# Patient Record
Sex: Male | Born: 1955 | Race: White | Hispanic: No | Marital: Married | Smoking: Current every day smoker
Health system: Southern US, Community
[De-identification: ages and names within clinical notes are randomized; demographics above are authoritative.]

---

## 2008-11-24 ENCOUNTER — Emergency Department (HOSPITAL_COMMUNITY): Admission: EM | Admit: 2008-11-24 | Discharge: 2008-11-24 | Payer: Self-pay | Admitting: Emergency Medicine

## 2010-09-02 LAB — POCT I-STAT, CHEM 8
BUN: 7 mg/dL (ref 6–23)
Calcium, Ion: 1.05 mmol/L — ABNORMAL LOW (ref 1.12–1.32)
Creatinine, Ser: 1.1 mg/dL (ref 0.4–1.5)
Hemoglobin: 15.3 g/dL (ref 13.0–17.0)
Sodium: 138 mEq/L (ref 135–145)
TCO2: 19 mmol/L (ref 0–100)

## 2012-04-20 ENCOUNTER — Ambulatory Visit (HOSPITAL_COMMUNITY)
Admission: RE | Admit: 2012-04-20 | Discharge: 2012-04-20 | Disposition: A | Discharge status: Home / Self Care | Payer: Disability Insurance | Source: Ambulatory Visit | Attending: Family Medicine | Admitting: Family Medicine

## 2012-04-20 ENCOUNTER — Other Ambulatory Visit (HOSPITAL_COMMUNITY): Payer: Self-pay | Admitting: *Deleted

## 2012-04-20 DIAGNOSIS — M25519 Pain in unspecified shoulder: Secondary | ICD-10-CM

## 2012-04-20 DIAGNOSIS — M25569 Pain in unspecified knee: Secondary | ICD-10-CM | POA: Insufficient documentation

## 2016-08-13 ENCOUNTER — Emergency Department (HOSPITAL_COMMUNITY): Payer: Medicaid Other

## 2016-08-13 ENCOUNTER — Observation Stay (HOSPITAL_COMMUNITY)
Admission: EM | Admit: 2016-08-13 | Discharge: 2016-08-14 | Disposition: A | Discharge status: Home / Self Care | Payer: Medicaid Other | Attending: Family Medicine | Admitting: Family Medicine

## 2016-08-13 ENCOUNTER — Encounter (HOSPITAL_COMMUNITY): Payer: Self-pay | Admitting: Emergency Medicine

## 2016-08-13 DIAGNOSIS — Z7189 Other specified counseling: Secondary | ICD-10-CM

## 2016-08-13 DIAGNOSIS — F17210 Nicotine dependence, cigarettes, uncomplicated: Secondary | ICD-10-CM | POA: Diagnosis not present

## 2016-08-13 DIAGNOSIS — R109 Unspecified abdominal pain: Secondary | ICD-10-CM | POA: Diagnosis present

## 2016-08-13 DIAGNOSIS — R627 Adult failure to thrive: Secondary | ICD-10-CM | POA: Diagnosis present

## 2016-08-13 DIAGNOSIS — K869 Disease of pancreas, unspecified: Secondary | ICD-10-CM | POA: Diagnosis not present

## 2016-08-13 DIAGNOSIS — F1290 Cannabis use, unspecified, uncomplicated: Secondary | ICD-10-CM | POA: Insufficient documentation

## 2016-08-13 DIAGNOSIS — R0602 Shortness of breath: Secondary | ICD-10-CM | POA: Insufficient documentation

## 2016-08-13 DIAGNOSIS — R6251 Failure to thrive (child): Secondary | ICD-10-CM

## 2016-08-13 DIAGNOSIS — K8689 Other specified diseases of pancreas: Secondary | ICD-10-CM

## 2016-08-13 DIAGNOSIS — Z515 Encounter for palliative care: Secondary | ICD-10-CM

## 2016-08-13 LAB — COMPREHENSIVE METABOLIC PANEL
ALBUMIN: 3.2 g/dL — AB (ref 3.5–5.0)
ALT: 11 U/L — ABNORMAL LOW (ref 17–63)
ANION GAP: 11 (ref 5–15)
AST: 23 U/L (ref 15–41)
Alkaline Phosphatase: 158 U/L — ABNORMAL HIGH (ref 38–126)
BUN: 15 mg/dL (ref 6–20)
CALCIUM: 9 mg/dL (ref 8.9–10.3)
CHLORIDE: 98 mmol/L — AB (ref 101–111)
CO2: 25 mmol/L (ref 22–32)
CREATININE: 1.05 mg/dL (ref 0.61–1.24)
GFR calc non Af Amer: >60 mL/min (ref >60)
GLUCOSE: 117 mg/dL — AB (ref 65–99)
Potassium: 3.8 mmol/L (ref 3.5–5.1)
SODIUM: 134 mmol/L — AB (ref 135–145)
Total Bilirubin: 0.4 mg/dL (ref 0.3–1.2)
Total Protein: 6.6 g/dL (ref 6.5–8.1)

## 2016-08-13 LAB — CBC WITH DIFFERENTIAL/PLATELET
BASOS PCT: 0 %
Basophils Absolute: 0 10*3/uL (ref 0.0–0.1)
EOS ABS: 0.1 10*3/uL (ref 0.0–0.7)
Eosinophils Relative: 1 %
HCT: 39.4 % (ref 39.0–52.0)
Hemoglobin: 13.5 g/dL (ref 13.0–17.0)
Lymphocytes Relative: 15 %
Lymphs Abs: 1.4 10*3/uL (ref 0.7–4.0)
MCH: 31.5 pg (ref 26.0–34.0)
MCHC: 34.3 g/dL (ref 30.0–36.0)
MCV: 91.8 fL (ref 78.0–100.0)
MONO ABS: 0.9 10*3/uL (ref 0.1–1.0)
MONOS PCT: 10 %
NEUTROS PCT: 74 %
Neutro Abs: 7.2 10*3/uL (ref 1.7–7.7)
PLATELETS: 397 10*3/uL (ref 150–400)
RBC: 4.29 MIL/uL (ref 4.22–5.81)
RDW: 14.4 % (ref 11.5–15.5)
WBC: 9.6 10*3/uL (ref 4.0–10.5)

## 2016-08-13 LAB — TSH: TSH: 1.779 u[IU]/mL (ref 0.350–4.500)

## 2016-08-13 LAB — URINALYSIS, ROUTINE W REFLEX MICROSCOPIC
Bilirubin Urine: NEGATIVE
Glucose, UA: NEGATIVE mg/dL
Hgb urine dipstick: NEGATIVE
Leukocytes, UA: NEGATIVE
NITRITE: NEGATIVE
PH: 6 (ref 5.0–8.0)
Protein, ur: 100 mg/dL — AB
SPECIFIC GRAVITY, URINE: 1.015 (ref 1.005–1.030)

## 2016-08-13 LAB — TROPONIN I

## 2016-08-13 LAB — URINALYSIS, MICROSCOPIC (REFLEX): RBC / HPF: NONE SEEN RBC/hpf (ref 0–5)

## 2016-08-13 LAB — ETHANOL

## 2016-08-13 LAB — LIPASE, BLOOD: Lipase: 24 U/L (ref 11–51)

## 2016-08-13 MED ORDER — DEXTROSE 5 % IV SOLN
500.0000 mg | INTRAVENOUS | Status: DC
  Administered 2016-08-13: 500 mg via INTRAVENOUS
  Filled 2016-08-13: qty 500

## 2016-08-13 MED ORDER — ONDANSETRON HCL 4 MG/2ML IJ SOLN: 4.0000 mg | Freq: Once | INTRAMUSCULAR | Status: DC

## 2016-08-13 MED ORDER — MORPHINE SULFATE (PF) 4 MG/ML IV SOLN: 4.0000 mg | Freq: Once | INTRAVENOUS | Status: DC

## 2016-08-13 MED ORDER — SODIUM CHLORIDE 0.9 % IV SOLN
1000.0000 mL | Freq: Once | INTRAVENOUS | Status: AC
  Administered 2016-08-13: 1000 mL via INTRAVENOUS

## 2016-08-13 MED ORDER — DEXTROSE 5 % IV SOLN
1.0000 g | Freq: Once | INTRAVENOUS | Status: AC
  Administered 2016-08-13: 1 g via INTRAVENOUS
  Filled 2016-08-13: qty 10

## 2016-08-13 MED ORDER — IOPAMIDOL (ISOVUE-300) INJECTION 61%
100.0000 mL | Freq: Once | INTRAVENOUS | Status: AC | PRN
  Administered 2016-08-13: 100 mL via INTRAVENOUS

## 2016-08-13 MED ORDER — MORPHINE SULFATE (PF) 4 MG/ML IV SOLN
4.0000 mg | Freq: Once | INTRAVENOUS | Status: AC
  Administered 2016-08-13: 4 mg via INTRAVENOUS
  Filled 2016-08-13: qty 1

## 2016-08-13 NOTE — ED Provider Notes (Signed)
[facility]-EMERGENCY DEPT Provider Note   CSN: [id number] Arrival date & time: 08/13/16  1916     History   Chief Complaint Chief Complaint  Patient presents with  . Abdominal Pain    HPI Jeffrey Ware is a 61 y.o. male.  Pt presents to the ED today with abdominal pain and sob.  The pt said his pain has been going on for the last few weeks.  The SOB has just been bothering him in the last few days.  The pt said that he could not lie flat.  He gets sob walking across the room.  He said he's lost 50 pounds in 3 months.  His friend, who brings him in, said that his wife died in 03/30/2023 and he has been very depressed.  She said people have been bringing him food, but he does not eat it.  He does not go anywhere.  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  He has not seen a doctor in 30 years, so has no medical history.  He only came tonight because his sob was so bad that he thought he was going to die.      History reviewed. No pertinent past medical history.  Patient Active Problem List   Diagnosis Date Noted  . Palliative care encounter   . Goals of care, counseling/discussion   . DNR (do not resuscitate) discussion   . Encounter for hospice care discussion   . Pancreatic mass 08/13/2016  . FTT (failure to thrive) in adult 08/13/2016  . Abdominal pain 08/13/2016    History reviewed. No pertinent surgical history.     Home Medications    Prior to Admission medications   Medication Sig Start Date End Date Taking? Authorizing Provider  feeding supplement, ENSURE ENLIVE, (ENSURE ENLIVE) LIQD Take 237 mLs by mouth 2 (two) times daily between meals. 08/14/16   Filbert Schilder, MD  LORazepam (ATIVAN) 1 MG tablet Take 1 tablet (1 mg total) by mouth 2 (two) times daily as needed for anxiety or sleep. 08/14/16 08/16/16  Filbert Schilder, MD  ondansetron (ZOFRAN) 4 MG tablet Take 1 tablet (4 mg total) by mouth every 6 (six) hours as needed for nausea. 08/14/16   Filbert Schilder, MD  oxyCODONE (OXY IR/ROXICODONE) 5 MG immediate release tablet Take 1 tablet (5 mg total) by mouth every 4 (four) hours as needed for moderate pain. 08/14/16   Filbert Schilder, MD  oxyCODONE (OXYCONTIN) 10 mg 12 hr tablet Take 1 tablet (10 mg total) by mouth every 12 (twelve) hours. 08/14/16   Filbert Schilder, MD    Family History History reviewed. No pertinent family history.  Social History Social History  Substance Use Topics  . Smoking status: Current Every Day Smoker    Packs/day: 0.50    Types: Cigarettes  . Smokeless tobacco: Never Used  . Alcohol use No     Comment: quit x3 months      Allergies   Patient has no known allergies.   Review of Systems Review of Systems  Respiratory: Positive for shortness of breath.   Gastrointestinal: Positive for abdominal pain.  All other systems reviewed and are negative.    Physical Exam Updated Vital Signs BP 108/74 (BP Location: Right Arm)   Pulse (!) 109   Temp 97.5 F (36.4 C) (Oral)   Resp 20   Ht 5\' 10"  (1.778 m)   Wt 116 lb 12.8 oz (53 kg)   SpO2 100%  BMI 16.76 kg/m   Physical Exam  Constitutional: He is oriented to person, place, and time. He appears cachectic.  HENT:  Head: Normocephalic and atraumatic.  Right Ear: External ear normal.  Left Ear: External ear normal.  Nose: Nose normal.  Mouth/Throat: Mucous membranes are dry.  Eyes: Conjunctivae and EOM are normal. Pupils are equal, round, and reactive to light.  Neck: Normal range of motion. Neck supple.  Cardiovascular: Normal rate, regular rhythm, normal heart sounds and intact distal pulses.   Pulmonary/Chest: Effort normal and breath sounds normal.  Abdominal: There is generalized tenderness.  Musculoskeletal: Normal range of motion.  Neurological: He is alert and oriented to person, place, and time.  Skin: Skin is warm.  Psychiatric: He has a normal mood and affect. His behavior is normal. Judgment and thought content normal.    Nursing note and vitals reviewed.    ED Treatments / Results  Labs (all labs ordered are listed, but only abnormal results are displayed) Labs Reviewed  COMPREHENSIVE METABOLIC PANEL - Abnormal; Notable for the following:       Result Value   Sodium 134 (*)    Chloride 98 (*)    Glucose, Bld 117 (*)    Albumin 3.2 (*)    ALT 11 (*)    Alkaline Phosphatase 158 (*)    All other components within normal limits  URINALYSIS, ROUTINE W REFLEX MICROSCOPIC - Abnormal; Notable for the following:    Ketones, ur TRACE (*)    Protein, ur 100 (*)    All other components within normal limits  URINALYSIS, MICROSCOPIC (REFLEX) - Abnormal; Notable for the following:    Bacteria, UA RARE (*)    Squamous Epithelial / LPF 0-5 (*)    All other components within normal limits  CANCER ANTIGEN 19-9 - Abnormal; Notable for the following:    CA 19-9 16,770 (*)    All other components within normal limits  CBC WITH DIFFERENTIAL/PLATELET  LIPASE, BLOOD  TROPONIN I  ETHANOL  TSH    EKG  EKG Interpretation  Date/Time:  Tuesday August 13 2016 20:05:59 EDT Ventricular Rate:  115 PR Interval:    QRS Duration: 101 QT Interval:  338 QTC Calculation: 468 R Axis:   90 Text Interpretation:  Sinus tachycardia Anterior infarct, old Confirmed by [facility] MD, Alayasia Breeding [id number]) on 08/13/2016 8:29:09 PM        Procedures Procedures (including critical care time)  Medications Ordered in ED Medications  0.9 %  sodium chloride infusion (1,000 mLs Intravenous New Bag/Given 08/13/16 2009)  iopamidol (ISOVUE-300) 61 % injection 100 mL (100 mLs Intravenous Contrast Given 08/13/16 2112)  morphine 4 MG/ML injection 4 mg (4 mg Intravenous Given 08/13/16 2205)  cefTRIAXone (ROCEPHIN) 1 g in dextrose 5 % 50 mL IVPB (0 g Intravenous Stopped 08/13/16 2322)     Initial Impression / Assessment and Plan / ED Course  I have reviewed the triage vital signs and the nursing notes.  Pertinent labs & imaging results that  were available during my care of the patient were reviewed by me and considered in my medical decision making (see chart for details).    CT scan results pending at time of shift change.  Pt signed out to Dr. Adriana Simas.  Final Clinical Impressions(s) / ED Diagnoses   Final diagnoses:  Failure to thrive (0-17)  Pancreatic mass    New Prescriptions Discharge Medication List as of 08/14/2016  2:09 PM    START taking these medications   Details  feeding supplement, ENSURE ENLIVE, (ENSURE ENLIVE) LIQD Take 237 mLs by mouth 2 (two) times daily between meals., Starting Wed 08/14/2016, Print    LORazepam (ATIVAN) 1 MG tablet Take 1 tablet (1 mg total) by mouth 2 (two) times daily as needed for anxiety or sleep., Starting Wed 08/14/2016, Until Fri 08/16/2016, Print    ondansetron (ZOFRAN) 4 MG tablet Take 1 tablet (4 mg total) by mouth every 6 (six) hours as needed for nausea., Starting Wed 08/14/2016, Print    oxyCODONE (OXY IR/ROXICODONE) 5 MG immediate release tablet Take 1 tablet (5 mg total) by mouth every 4 (four) hours as needed for moderate pain., Starting Wed 08/14/2016, Print    oxyCODONE (OXYCONTIN) 10 mg 12 hr tablet Take 1 tablet (10 mg total) by mouth every 12 (twelve) hours., Starting Wed 08/14/2016, Print         Jacalyn LefevreJulie Ahnesty Finfrock, MD 08/15/16 1536

## 2016-08-13 NOTE — ED Triage Notes (Signed)
abd pain and sob with activity x 3months.  Has lost over 50lbs in the last 3 months.  Has had no medical care for over 30 years.

## 2016-08-13 NOTE — ED Provider Notes (Signed)
CT scan chest and abdomen reveal a probable metastatic pancreatic cancer. Additionally, there is a concern for atypical pneumonia. Will start Rocephin and Zithromax. Admit to general medicine.   Donnetta HutchingBrian Deloss Amico, MD 08/13/16 2312

## 2016-08-14 ENCOUNTER — Encounter (HOSPITAL_COMMUNITY): Payer: Self-pay | Admitting: *Deleted

## 2016-08-14 DIAGNOSIS — Z515 Encounter for palliative care: Secondary | ICD-10-CM

## 2016-08-14 DIAGNOSIS — K869 Disease of pancreas, unspecified: Secondary | ICD-10-CM

## 2016-08-14 DIAGNOSIS — R627 Adult failure to thrive: Secondary | ICD-10-CM

## 2016-08-14 DIAGNOSIS — R109 Unspecified abdominal pain: Secondary | ICD-10-CM

## 2016-08-14 DIAGNOSIS — Z7189 Other specified counseling: Secondary | ICD-10-CM

## 2016-08-14 MED ORDER — SODIUM CHLORIDE 0.9% FLUSH: 3.0000 mL | INTRAVENOUS | Status: DC | PRN

## 2016-08-14 MED ORDER — SODIUM CHLORIDE 0.9% FLUSH
3.0000 mL | Freq: Two times a day (BID) | INTRAVENOUS | Status: DC
  Administered 2016-08-14: 3 mL via INTRAVENOUS

## 2016-08-14 MED ORDER — LORAZEPAM 1 MG PO TABS
1.0000 mg | ORAL_TABLET | Freq: Two times a day (BID) | ORAL | Status: DC | PRN
  Administered 2016-08-14: 1 mg via ORAL
  Filled 2016-08-14: qty 1

## 2016-08-14 MED ORDER — OXYCODONE HCL 5 MG PO TABS: 5.0000 mg | ORAL_TABLET | ORAL | 0 refills | Status: AC | PRN

## 2016-08-14 MED ORDER — ONDANSETRON HCL 4 MG PO TABS: 4.0000 mg | ORAL_TABLET | Freq: Four times a day (QID) | ORAL | 0 refills | Status: AC | PRN

## 2016-08-14 MED ORDER — SODIUM CHLORIDE 0.9 % IV SOLN: 250.0000 mL | INTRAVENOUS | Status: DC | PRN

## 2016-08-14 MED ORDER — LORAZEPAM 1 MG PO TABS: 1.0000 mg | ORAL_TABLET | Freq: Two times a day (BID) | ORAL | 0 refills | Status: AC | PRN

## 2016-08-14 MED ORDER — ENSURE ENLIVE PO LIQD: 237.0000 mL | Freq: Two times a day (BID) | ORAL | 0 refills | Status: AC

## 2016-08-14 MED ORDER — ONDANSETRON HCL 4 MG/2ML IJ SOLN: 4.0000 mg | Freq: Four times a day (QID) | INTRAMUSCULAR | Status: DC | PRN

## 2016-08-14 MED ORDER — OXYCODONE HCL ER 10 MG PO T12A
10.0000 mg | EXTENDED_RELEASE_TABLET | Freq: Two times a day (BID) | ORAL | Status: DC
  Administered 2016-08-14: 10 mg via ORAL
  Filled 2016-08-14: qty 1

## 2016-08-14 MED ORDER — ENSURE ENLIVE PO LIQD: 237.0000 mL | Freq: Two times a day (BID) | ORAL | Status: DC

## 2016-08-14 MED ORDER — OXYCODONE HCL 5 MG PO TABS
5.0000 mg | ORAL_TABLET | ORAL | Status: DC | PRN
  Administered 2016-08-14: 5 mg via ORAL
  Filled 2016-08-14 (×2): qty 1

## 2016-08-14 MED ORDER — OXYCODONE HCL ER 10 MG PO T12A: 10.0000 mg | EXTENDED_RELEASE_TABLET | Freq: Two times a day (BID) | ORAL | 0 refills | Status: AC

## 2016-08-14 MED ORDER — ONDANSETRON HCL 4 MG PO TABS: 4.0000 mg | ORAL_TABLET | Freq: Four times a day (QID) | ORAL | Status: DC | PRN

## 2016-08-14 MED ORDER — ORAL CARE MOUTH RINSE
15.0000 mL | Freq: Two times a day (BID) | OROMUCOSAL | Status: DC
  Administered 2016-08-14 (×2): 15 mL via OROMUCOSAL

## 2016-08-14 NOTE — Discharge Summary (Signed)
Physician Discharge Summary  MUNG RINKER ZOX:[id number] DOB: [birth year] DOA: 08/13/2016  PCP: No PCP Per Patient  Admit date: 08/13/2016 Discharge date: 08/17/2016  Admitted From: Home Disposition:  Home with Hospice Services   Recommendations for Outpatient Follow-up:  1. Follow up with PCP in 1-2 weeks 2. Hospice to come to your house 3. Pain medications as prescribed  Home Health: Yes- hospice services  Equipment/Devices: None   Discharge Condition: Hospice  CODE STATUS: Full Code Diet recommendation: Regular  Brief/Interim Summary: Jeffrey Ware is a 61 y.o. male with medical history significant of nothing comes in with 2-3 month history of progressive worsening abdominal pain to the point he cannot handle it anymore which brought him to the ED tonight.  Pt reports loosing his wife in 03/12/2023 she died of sepsis.  Since her death, he has felt like he would not live past 6 months.  He has lost over 50 lbs since her death and thought this was related to his grief.  He does have one daughter.  The abdominal pain started almost 3 months ago, mainly in the epigastric area and back.  It is severe and he has no pain meds to take at home that are strong enough to help.  He lives alone.  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  Pt referred for admission for newly diagnosis of cancer and pain management.  He has not had any cough or fever.  Discharge Diagnoses:  Principal Problem:   Pancreatic mass Active Problems:   FTT (failure to thrive) in adult   Abdominal pain   Palliative care encounter   Goals of care, counseling/discussion   DNR (do not resuscitate) discussion   Encounter for hospice care discussion  Patient decided to forgo diagnostic thoracentesis and states he would like hospice services as he does not want to pursue treatment options.  Discussed with oncology who state treatment for this advanced cancer which is likely pancreatic is  palliative and for mostly symptom relief.  Palliative care consulted and assisted in setting patient up with home hospice services.  Discharge Instructions  Discharge Instructions    Activity as tolerated - No restrictions    Complete by:  As directed    Call Jeffrey Ware for:  difficulty breathing, headache or visual disturbances    Complete by:  As directed    Call Jeffrey Ware for:  severe uncontrolled pain    Complete by:  As directed    Diet general    Complete by:  As directed    Discharge instructions    Complete by:  As directed    Get prescriptions filled to last until Hospice comes out to see you tomorrow morning     Allergies as of 08/14/2016   No Known Allergies     Medication List    TAKE these medications   feeding supplement (ENSURE ENLIVE) Liqd Take 237 mLs by mouth 2 (two) times daily between meals.   ondansetron 4 MG tablet Commonly known as:  ZOFRAN Take 1 tablet (4 mg total) by mouth every 6 (six) hours as needed for nausea.   oxyCODONE 5 MG immediate release tablet Commonly known as:  Oxy IR/ROXICODONE Take 1 tablet (5 mg total) by mouth every 4 (four) hours as needed for moderate pain.   oxyCODONE 10 mg 12 hr tablet Commonly known as:  OXYCONTIN Take 1 tablet (10 mg total) by mouth every 12 (twelve) hours.     ASK your doctor about  these medications   LORazepam 1 MG tablet Commonly known as:  ATIVAN Take 1 tablet (1 mg total) by mouth 2 (two) times daily as needed for anxiety or sleep. Ask about: Should I take this medication?      Follow-up Information    KEFALAS,THOMAS, PA-C Follow up.   Specialty:  Oncology Why:  office to call patient foe appointment. Contact information: [address] [place] [address] [phone]        [facility] Follow up.   Contact information: 24-hr on call number [phone]         No Known Allergies  Consultations:  Oncology  Palliative Medicine  CM   Procedures/Studies: Ct Chest W  Contrast  Result Date: 08/13/2016 CLINICAL DATA:  Chronic generalized abdominal pain and shortness of breath. Loss of 50 pounds over the past 3 months. Initial encounter. EXAM: CT CHEST, ABDOMEN, AND PELVIS WITH CONTRAST TECHNIQUE: Multidetector CT imaging of the chest, abdomen and pelvis was performed following the standard protocol during bolus administration of intravenous contrast. CONTRAST:  ISOVUE-300 IOPAMIDOL (ISOVUE-300) INJECTION 61% COMPARISON:  CT of the chest abdomen and pelvis performed 11/24/2008 FINDINGS: CT CHEST FINDINGS Cardiovascular: Diffuse coronary artery calcifications are seen. The heart remains normal in size. The thoracic aorta is unremarkable, aside from scattered calcification along the aortic arch and proximal great vessels. Mediastinum/Nodes: No mediastinal lymphadenopathy is seen. No pericardial effusion is identified. Tiny hypodensities within the thyroid gland are likely benign, given their size. No axillary lymphadenopathy is appreciated. Lungs/Pleura: Numerous nodules and opacities of varying size are noted throughout both lungs, along with small to moderate right and trace left pleural effusions. There is underlying interstitial prominence. This is highly suspicious for metastatic disease, with superimposed pneumonia, possibly atypical in nature. Underlying emphysema is noted at the lung apices. No pneumothorax is seen. Musculoskeletal: No acute osseous abnormalities are identified. The visualized musculature is unremarkable in appearance. CT ABDOMEN PELVIS FINDINGS Hepatobiliary: Multiple masses of varying size are noted within the liver, with surrounding areas of contrast blush, concerning for metastatic disease given the finding in the pancreas. The gallbladder is grossly unremarkable in appearance. The common bile duct remains normal in caliber. Pancreas: There is a very poorly defined diffusely infiltrative mass at the expected location of the body of the pancreas,  measuring approximately 6.2 x 5.8 x 5.3 cm, with enlarged surrounding nodes and encasement of the abdominal aorta at the level of the renal arteries. There is mild compression of the abdominal aorta at this level. The mass abuts the lesser curvature of the stomach. The remaining proximal body and head of the pancreas are grossly unremarkable. Diffuse prominence of surrounding vasculature tracking into the pancreatic body likely reflects angiogenesis. Findings are compatible with primary pancreatic malignancy. There is marked narrowing of the superior mesenteric vein and near complete effacement of the splenic vein due to the underlying mass. The mass partially encases the inferior vena cava and wraps about the anterior aspect of vertebral body L2. Spleen: Vague fluid is seen tracking about the spleen. The spleen is otherwise unremarkable in appearance. Adrenals/Urinary Tract: There is decreased attenuation involving portions of the left adrenal gland, likely reflecting infiltration of mass, given surrounding tumor. The right adrenal gland is unremarkable in appearance. There is marked chronic right renal atrophy. Minimal left-sided hydronephrosis is of uncertain significance. The left kidney is otherwise grossly unremarkable. Stomach/Bowel: The appendix is normal in caliber, without evidence of appendicitis. The colon is grossly unremarkable in appearance. The  visualized small bowel is grossly unremarkable. Vascular/Lymphatic: Scattered calcification is seen along the abdominal aorta and its branches. As described above, there is mild compression of the abdominal aorta at the level of the mass. No definite retroperitoneal or pelvic sidewall lymphadenopathy is seen. Reproductive: The bladder is mildly distended and grossly unremarkable. The prostate remains normal in size, with minimal calcification. Other: A small amount of free fluid is seen tracking within the abdomen and pelvis. Musculoskeletal: No acute osseous  abnormalities are identified. The visualized musculature is unremarkable in appearance. IMPRESSION: 1. Primary pancreatic malignancy, with a large very poorly defined diffusely infiltrative mass at the expected location of the body of the pancreas, measuring 6.2 x 5.8 x 5.3 cm. Vague mass encases the abdominal aorta at the level of the renal arteries, with mild compression of the abdominal aorta. Diffuse prominence of surrounding vasculature tracking into the pancreatic body, likely reflecting angiogenesis. 2. Marked narrowing of the superior mesenteric vein and near complete effacement of the splenic vein due to the underlying mass. The mass partially encases the inferior vena cava and wraps about the anterior aspect of vertebral body L2. 3. Small to moderate right and trace left pleural effusions, with numerous nodules and opacities of varying size throughout both lungs. Underlying interstitial prominence noted. This is highly suspicious of metastatic disease, with superimposed pneumonia, possibly atypical in nature. 4. Multiple masses of varying size within the liver, with surrounding areas of contrast blush, concerning for metastatic disease. 5. Decreased attenuation involving portions of the left adrenal gland, likely reflecting infiltration of mass. 6. Small amount of fluid noted tracking about the abdomen and pelvis. 7. Scattered aortic atherosclerosis. 8. Diffuse coronary artery calcification. 9. Marked chronic right renal atrophy. Minimal left-sided hydronephrosis, of uncertain significance. These results were called by telephone at the time of interpretation on 08/13/2016 at 10:29 pm to Dr. Adriana Simas, who verbally acknowledged these results. Electronically Signed   By: Roanna Raider M.D.   On: 08/13/2016 22:30   Ct Abdomen Pelvis W Contrast  Result Date: 08/13/2016 CLINICAL DATA:  Chronic generalized abdominal pain and shortness of breath. Loss of 50 pounds over the past 3 months. Initial encounter. EXAM:  CT CHEST, ABDOMEN, AND PELVIS WITH CONTRAST TECHNIQUE: Multidetector CT imaging of the chest, abdomen and pelvis was performed following the standard protocol during bolus administration of intravenous contrast. CONTRAST:  ISOVUE-300 IOPAMIDOL (ISOVUE-300) INJECTION 61% COMPARISON:  CT of the chest abdomen and pelvis performed 11/24/2008 FINDINGS: CT CHEST FINDINGS Cardiovascular: Diffuse coronary artery calcifications are seen. The heart remains normal in size. The thoracic aorta is unremarkable, aside from scattered calcification along the aortic arch and proximal great vessels. Mediastinum/Nodes: No mediastinal lymphadenopathy is seen. No pericardial effusion is identified. Tiny hypodensities within the thyroid gland are likely benign, given their size. No axillary lymphadenopathy is appreciated. Lungs/Pleura: Numerous nodules and opacities of varying size are noted throughout both lungs, along with small to moderate right and trace left pleural effusions. There is underlying interstitial prominence. This is highly suspicious for metastatic disease, with superimposed pneumonia, possibly atypical in nature. Underlying emphysema is noted at the lung apices. No pneumothorax is seen. Musculoskeletal: No acute osseous abnormalities are identified. The visualized musculature is unremarkable in appearance. CT ABDOMEN PELVIS FINDINGS Hepatobiliary: Multiple masses of varying size are noted within the liver, with surrounding areas of contrast blush, concerning for metastatic disease given the finding in the pancreas. The gallbladder is grossly unremarkable in appearance. The common bile duct remains normal in caliber. Pancreas:  There is a very poorly defined diffusely infiltrative mass at the expected location of the body of the pancreas, measuring approximately 6.2 x 5.8 x 5.3 cm, with enlarged surrounding nodes and encasement of the abdominal aorta at the level of the renal arteries. There is mild compression of  the abdominal aorta at this level. The mass abuts the lesser curvature of the stomach. The remaining proximal body and head of the pancreas are grossly unremarkable. Diffuse prominence of surrounding vasculature tracking into the pancreatic body likely reflects angiogenesis. Findings are compatible with primary pancreatic malignancy. There is marked narrowing of the superior mesenteric vein and near complete effacement of the splenic vein due to the underlying mass. The mass partially encases the inferior vena cava and wraps about the anterior aspect of vertebral body L2. Spleen: Vague fluid is seen tracking about the spleen. The spleen is otherwise unremarkable in appearance. Adrenals/Urinary Tract: There is decreased attenuation involving portions of the left adrenal gland, likely reflecting infiltration of mass, given surrounding tumor. The right adrenal gland is unremarkable in appearance. There is marked chronic right renal atrophy. Minimal left-sided hydronephrosis is of uncertain significance. The left kidney is otherwise grossly unremarkable. Stomach/Bowel: The appendix is normal in caliber, without evidence of appendicitis. The colon is grossly unremarkable in appearance. The visualized small bowel is grossly unremarkable. Vascular/Lymphatic: Scattered calcification is seen along the abdominal aorta and its branches. As described above, there is mild compression of the abdominal aorta at the level of the mass. No definite retroperitoneal or pelvic sidewall lymphadenopathy is seen. Reproductive: The bladder is mildly distended and grossly unremarkable. The prostate remains normal in size, with minimal calcification. Other: A small amount of free fluid is seen tracking within the abdomen and pelvis. Musculoskeletal: No acute osseous abnormalities are identified. The visualized musculature is unremarkable in appearance. IMPRESSION: 1. Primary pancreatic malignancy, with a large very poorly defined diffusely  infiltrative mass at the expected location of the body of the pancreas, measuring 6.2 x 5.8 x 5.3 cm. Vague mass encases the abdominal aorta at the level of the renal arteries, with mild compression of the abdominal aorta. Diffuse prominence of surrounding vasculature tracking into the pancreatic body, likely reflecting angiogenesis. 2. Marked narrowing of the superior mesenteric vein and near complete effacement of the splenic vein due to the underlying mass. The mass partially encases the inferior vena cava and wraps about the anterior aspect of vertebral body L2. 3. Small to moderate right and trace left pleural effusions, with numerous nodules and opacities of varying size throughout both lungs. Underlying interstitial prominence noted. This is highly suspicious of metastatic disease, with superimposed pneumonia, possibly atypical in nature. 4. Multiple masses of varying size within the liver, with surrounding areas of contrast blush, concerning for metastatic disease. 5. Decreased attenuation involving portions of the left adrenal gland, likely reflecting infiltration of mass. 6. Small amount of fluid noted tracking about the abdomen and pelvis. 7. Scattered aortic atherosclerosis. 8. Diffuse coronary artery calcification. 9. Marked chronic right renal atrophy. Minimal left-sided hydronephrosis, of uncertain significance. These results were called by telephone at the time of interpretation on 08/13/2016 at 10:29 pm to Dr. Adriana Simasook, who verbally acknowledged these results. Electronically Signed   By: Roanna RaiderJeffery  Chang M.D.   On: 08/13/2016 22:30      Subjective: Patient slouched in bed.  Voices he has significant abdominal pain- pain medicine needing to be given more frequently to help control pain.  Discharge Exam: Vitals:   08/13/16 2300 08/14/16 0038  BP: 119/85 108/74  Pulse: (!) 110 (!) 109  Resp: 20 20  Temp:  97.5 F (36.4 C)   Vitals:   08/13/16 2205 08/13/16 2300 08/14/16 0000 08/14/16 0038   BP: 128/83 119/85  108/74  Pulse: (!) 109 (!) 110  (!) 109  Resp: (!) 26 20  20   Temp:    97.5 F (36.4 C)  TempSrc:    Oral  SpO2: 99% 99%  100%  Weight:    53 kg (116 lb 12.8 oz)  Height:   5\' 10"  (1.778 m)     General: Pt is alert, awake, mild distress, appears in pain, thin cachectic male Cardiovascular: RRR, S1/S2 +, no rubs, no gallops Respiratory: CTA bilaterally, no wheezing, no rhonchi Abdominal: Soft, NT, ND, bowel sounds + Extremities: no edema, no cyanosis    The results of significant diagnostics from this hospitalization (including imaging, microbiology, ancillary and laboratory) are listed below for reference.     Microbiology: No results found for this or any previous visit (from the past 240 hour(s)).   Labs: BNP (last 3 results) No results for input(s): BNP in the last 8760 hours. Basic Metabolic Panel:  Recent Labs Lab 08/13/16 2006  NA 134*  K 3.8  CL 98*  CO2 25  GLUCOSE 117*  BUN 15  CREATININE 1.05  CALCIUM 9.0   Liver Function Tests:  Recent Labs Lab 08/13/16 2006  AST 23  ALT 11*  ALKPHOS 158*  BILITOT 0.4  PROT 6.6  ALBUMIN 3.2*    Recent Labs Lab 08/13/16 2006  LIPASE 24   No results for input(s): AMMONIA in the last 168 hours. CBC:  Recent Labs Lab 08/13/16 2006  WBC 9.6  NEUTROABS 7.2  HGB 13.5  HCT 39.4  MCV 91.8  PLT 397   Cardiac Enzymes:  Recent Labs Lab 08/13/16 2006  TROPONINI <0.03   BNP: Invalid input(s): POCBNP CBG: No results for input(s): GLUCAP in the last 168 hours. D-Dimer No results for input(s): DDIMER in the last 72 hours. Hgb A1c No results for input(s): HGBA1C in the last 72 hours. Lipid Profile No results for input(s): CHOL, HDL, LDLCALC, TRIG, CHOLHDL, LDLDIRECT in the last 72 hours. Thyroid function studies No results for input(s): TSH, T4TOTAL, T3FREE, THYROIDAB in the last 72 hours.  Invalid input(s): FREET3 Anemia work up No results for input(s): VITAMINB12, FOLATE,  FERRITIN, TIBC, IRON, RETICCTPCT in the last 72 hours. Urinalysis    Component Value Date/Time   COLORURINE YELLOW 08/13/2016 2200   APPEARANCEUR CLEAR 08/13/2016 2200   LABSPEC 1.015 08/13/2016 2200   PHURINE 6.0 08/13/2016 2200   GLUCOSEU NEGATIVE 08/13/2016 2200   HGBUR NEGATIVE 08/13/2016 2200   BILIRUBINUR NEGATIVE 08/13/2016 2200   KETONESUR TRACE (A) 08/13/2016 2200   PROTEINUR 100 (A) 08/13/2016 2200   NITRITE NEGATIVE 08/13/2016 2200   LEUKOCYTESUR NEGATIVE 08/13/2016 2200   Sepsis Labs Invalid input(s): PROCALCITONIN,  WBC,  LACTICIDVEN Microbiology No results found for this or any previous visit (from the past 240 hour(s)).   Time coordinating discharge: 40 minutes  SIGNED:   Katrinka Blazing, Jeffrey Ware  [facility] 08/17/2016, 6:08 PM Pager [phone] If 7PM-7AM, please contact night-coverage [email or web address] Password [email or web address]

## 2016-08-14 NOTE — Care Management Note (Signed)
Case Management Note  Patient Details  Name: Jeffrey Ware MRN: [id number] Date of Birth: [birth year]  Subjective/Objective:                  Pt admitted with pancreatic mass. He has terminal illness, has seen oncology and palliative. Pt wishes to DC home with hospice services. Pt has no PCP. Pt has no DME needs at this time. Pt has chosen [facility].  Action/Plan: Referral sent to [facility], case discussed with Shon HaleMary Beth. Hospice MD will see pt and assume care. Pt will be admitted to hospice services in AM at his home. Will DC home today. No other needs communicated by pt.   Expected Discharge Date:      08/14/2016            Expected Discharge Plan:  Home w Hospice Care  In-House Referral:  Hospice / Palliative Care  Discharge planning Services  CM Consult  Post Acute Care Choice:  Hospice Choice offered to:  Patient  [facility]  Status of Service:  Completed, signed off  [name], [name], RN 08/14/2016, 12:55 PM

## 2016-08-14 NOTE — Care Management (Signed)
Patient Information   Patient Name Jeffrey Ware, Jeffrey Ware ([id number]) Sex Male DOB 1956/02/20  Room Bed  A339 A339-01  Patient Demographics   Address [address] [place] [address] Phone [phone] (Home)  Patient Ethnicity & Race   Ethnic Group Patient Race  Not Hispanic or Latino White or Caucasian  Emergency Contact(s)   Name Relation Home Work Mobile  Jan Phyl VillageLee,Roy Friend [phone]    Ulysees BarnsWray,April Daughter [phone]    Documents on File    Status Date Received Description  Documents for the Patient  [place] HIPAA NOTICE OF PRIVACY - Scanned Not Received    [place] E-Signature HIPAA Notice of Privacy Received 04/20/12   Driver's License Not Received    Insurance Card Not Received    Advance Directives/Living Will/HCPOA/POA Not Received    Other Photo ID Not Received    Documents for the Encounter  AOB (Assignment of Insurance Benefits) Not Received    E-signature AOB Signed 08/13/16   MEDICARE RIGHTS Not Received    E-signature Medicare Rights     ED Patient Billing Extract   ED PB Summary  ED Patient Billing Extract   ED Encounter Summary  Cardiac Monitoring Strip  08/14/16   EKG  08/14/16   Admission Information   Attending Provider Admitting Provider Admission Type Admission Date/Time  Filbert SchilderAlexandria U Kadolph, MD Haydee Monicaachal A David, MD Emergency 08/13/16 1951  Discharge Date Hospital Service Auth/Cert Status Service Area   Internal Medicine Incomplete [place] SERVICE AREA  Unit Room/Bed Admission Status   AP-DEPT 300 A339/A339-01 Admission (Confirmed)   Admission   Complaint  sob - abd pain  Hospital Account   Name Acct ID Class Status Primary Coverage  Jeffrey Ware, Jeffrey Ware [id number] Observation Open None      Guarantor Account (for Hospital Account [id number])   Name Relation to Pt Service Area Active? Acct Type  Jeffrey Ware, Jeffrey Ware Self [facility] Yes Personal/Family  Address Phone    [address] [place], [place] [address] [phone](H)        Coverage Information  (for Hospital Account [id number])

## 2016-08-14 NOTE — Consult Note (Signed)
Consultation Note Date: 08/14/2016   Patient Name: Jeffrey Ware  DOB: 07-20-1955  MRN: [id number]  Age / Sex: 61 y.o., male  PCP: No Pcp Per Patient Referring Physician: Filbert Schilder, MD  Reason for Consultation: Establishing goals of care, Hospice Evaluation and Psychosocial/spiritual support  HPI/Patient Profile: 61 y.o. male  with past medical history of nothing pertinent admitted on 08/13/2016 with pancreatic mass with metastatic burden.   Clinical Assessment and Goals of Care: Mr. Pultz is resting quietly in bed. He greets me making but not keeping eye contact as I enter. Present at bedside today is friend Luanna Cole, and his wife, also friend Ree Kida.  Channing Mutters asks about nutrition, stating that Mr. Thoma has not been eating well and losing weight. I asked Mr. Ortez if his friends know what's going on with him, he states that they do. I share with Channing Mutters that we call cancer a thief, because it takes nutrition, muscle, and life; at this point Mr. Stock nods his head affirmatively. We talk about nutrition and the use of Ensure type drinks.  I share that it would not surprise me if in the future Mr. Brosky is only interested in drinking and not eating. I share the people near end of life often sleep more, interact less, and eat less. This is normal and expected.  We talk about symptom management. Mr. Coffin states that he is been taking oxycodone at home (he does not have a prescriber). He states that he takes 2 or 3 (5 mg) oxycodone per day. He states that his pain has worsened and this dose is no longer effective. Oxycodone 10 mg scheduled Q 12 hours, along with Ativan 1 mg PRN.   We talk about diagnostics, Mr. Walston states that he feels that he is dying, and sees no need for further testing including needle biopsies. I share with him that we will support his decisions, what he feels is best for himself. Mr. Reierson states he  wants to return to his own home where he feels he would be more comfortable.  We talk about a support system. Mr. Sprinkle friend Ree Kida and Channing Mutters state that they help provide care and someone is with him almost every day (but not 24 hours). I share the benefits of hospice, Mr. Mckenzie states that he feels this would be a good benefit for him and he's accepting of hospice in home. Angelica Chessman states that he is familiar with hospice and agrees that they would be of benefit to Mr. Friedland.  Healthcare power of attorney NEXT OF KIN - we talk about healthcare power of attorney, Mr. Scow states that he has never made a power of attorney, he has always been able to make choices for himself.  I share that legally, his daughter April is his decision maker, but he can name anyone he likes by filling out paperwork. I share that hospice will help them complete this paperwork if he so desires.   SUMMARY OF RECOMMENDATIONS   Home with the benefits  of hospice, no further cancer workup, no biopsies. Mr. Bastos states his goal is comfort and dignity.  Code Status/Advance Care Planning:  DNR - we discuss the concepts of allow a natural death. Mr. Earll elects for DNR today.  Symptom Management:   Increased Oxycodone to 10 mg Q 12 hours, with 5 mg Q4 hours PRN, added Ativan 1 mg Q 6 hours PRN  Palliative Prophylaxis:   Frequent Pain Assessment  Additional Recommendations (Limitations, Scope, Preferences):  treat the treatable, no extraordinary measures  Psycho-social/Spiritual:   Desire for further Chaplaincy support:no  Additional Recommendations: Education on Hospice  Prognosis:   < 3 months, would not be surprising based on metastatic cancer burden, and desire to focus on comfort and dignity.   Discharge Planning: Home with Hospice      Primary Diagnoses: Present on Admission: . Pancreatic mass . FTT (failure to thrive) in adult . Abdominal pain   I have reviewed the medical record, interviewed the  patient and family, and examined the patient. The following aspects are pertinent.  History reviewed. No pertinent past medical history. Social History   Social History  . Marital status: Married    Spouse name: N/A  . Number of children: N/A  . Years of education: N/A   Social History Main Topics  . Smoking status: Current Every Day Smoker    Packs/day: 0.50    Types: Cigarettes  . Smokeless tobacco: Never Used  . Alcohol use No     Comment: quit x3 months   . Drug use: Yes    Frequency: 1.0 time per week    Types: Marijuana     Comment: also uses rx opiates that he buys off the street   . Sexual activity: Not Asked   Other Topics Concern  . None   Social History Narrative  . None   History reviewed. No pertinent family history. Scheduled Meds: . feeding supplement (ENSURE ENLIVE)  237 mL Oral BID BM  . mouth rinse  15 mL Mouth Rinse BID  . oxyCODONE  10 mg Oral Q12H  . sodium chloride flush  3 mL Intravenous Q12H   Continuous Infusions: PRN Meds:.sodium chloride, LORazepam, ondansetron **OR** ondansetron (ZOFRAN) IV, oxyCODONE, sodium chloride flush Medications Prior to Admission:  Prior to Admission medications   Medication Sig Start Date End Date Taking? Authorizing Provider  feeding supplement, ENSURE ENLIVE, (ENSURE ENLIVE) LIQD Take 237 mLs by mouth 2 (two) times daily between meals. 08/14/16   Filbert Schilder, MD  LORazepam (ATIVAN) 1 MG tablet Take 1 tablet (1 mg total) by mouth 2 (two) times daily as needed for anxiety or sleep. 08/14/16 08/16/16  Filbert Schilder, MD  ondansetron (ZOFRAN) 4 MG tablet Take 1 tablet (4 mg total) by mouth every 6 (six) hours as needed for nausea. 08/14/16   Filbert Schilder, MD  oxyCODONE (OXY IR/ROXICODONE) 5 MG immediate release tablet Take 1 tablet (5 mg total) by mouth every 4 (four) hours as needed for moderate pain. 08/14/16   Filbert Schilder, MD  oxyCODONE (OXYCONTIN) 10 mg 12 hr tablet Take 1 tablet (10 mg  total) by mouth every 12 (twelve) hours. 08/14/16   Filbert Schilder, MD   No Known Allergies Review of Systems  Unable to perform ROS: Acuity of condition    Physical Exam  Constitutional: He is oriented to person, place, and time. No distress.  Makes but not keeps eye contact.   HENT:  Head: Normocephalic and atraumatic.  Cardiovascular:  Normal rate and regular rhythm.   Rate 110's at times  Pulmonary/Chest: Effort normal. No respiratory distress.  Abdominal: Soft. He exhibits no distension.  Musculoskeletal:  Severe muscle wasting  Neurological: He is oriented to person, place, and time.  Skin: Skin is warm and dry.  Nursing note and vitals reviewed.   Vital Signs: BP 108/74 (BP Location: Right Arm)   Pulse (!) 109   Temp 97.5 F (36.4 C) (Oral)   Resp 20   Ht 5\' 10"  (1.778 m)   Wt 53 kg (116 lb 12.8 oz)   SpO2 100%   BMI 16.76 kg/m  Pain Assessment: 0-10   Pain Score: 4    SpO2: SpO2: 100 % O2 Device:SpO2: 100 % O2 Flow Rate: .   IO: Intake/output summary:  Intake/Output Summary (Last 24 hours) at 08/14/16 1448 Last data filed at 08/13/16 2322  Gross per 24 hour  Intake               50 ml  Output                0 ml  Net               50 ml    LBM: Last BM Date: 08/10/16 Baseline Weight: Weight: 52.6 kg (116 lb) Most recent weight: Weight: 53 kg (116 lb 12.8 oz)     Palliative Assessment/Data:   Flowsheet Rows     Most Recent Value  Intake Tab  Referral Department  Hospitalist  Unit at Time of Referral  Med/Surg Unit  Palliative Care Primary Diagnosis  Cancer  Date Notified  08/14/16  Palliative Care Type  New Palliative care  Reason for referral  Clarify Goals of Care  Date of Admission  08/13/16  Date first seen by Palliative Care  08/14/16  # of days Palliative referral response time  0 Day(s)  # of days IP prior to Palliative referral  1  Clinical Assessment  Palliative Performance Scale Score  50%  Pain Max last 24 hours  Not able  to report  Pain Min Last 24 hours  Not able to report  Dyspnea Max Last 24 Hours  Not able to report  Dyspnea Min Last 24 hours  Not able to report  Psychosocial & Spiritual Assessment  Palliative Care Outcomes  Patient/Family meeting held?  Yes  Who was at the meeting?  friend Channing MuttersRoy and his wife, partner Ree KidaJack  Patient/Family wishes: Interventions discontinued/not started   Mechanical Ventilation  Palliative Care follow-up planned  -- [follow up while at [facility]]      Time In: 1115 Time Out: 1205 Time Total: 50 minutes Greater than 50%  of this time was spent counseling and coordinating care related to the above assessment and plan.  Signed by: Katheran Aweasha A Juliett Eastburn, NP   Please contact Palliative Medicine Team phone at [phone] for questions and concerns.  For individual provider: See Loretha StaplerAmion

## 2016-08-14 NOTE — Final Consult Note (Signed)
Oncology consulted on this patient with metastatic disease concerning for pancreatic cancer radiographically.  Palliative care was consulted and based upon their discussion, the patient has opted for Hospice intervention at home which is certainly reasonable.  I have personally discussed with Lillia Carmelasha Dove, NP (Palliative medicine).  If he were to change his mind, I would recommend a diagnostic thoracentesis.  If positive, then we have confirmed metastatic pancreatic cancer.  If negative, then possibly an US guided biopsy of liver lesion.    Either way, he has a terminal diagnosis.  Patient is not seen.  No charge.  If plan of care changes, please re-consult medical oncology.  Medical oncology signing off.  Kerly Rigsbee, PA-C 08/14/2016 1:22 PM

## 2016-08-14 NOTE — H&P (Signed)
History and Physical    Jeffrey Ware ZOX:[id number] DOB: 1956/04/11 DOA: 08/13/2016  PCP: No PCP Per Patient  Patient coming from: home  Chief Complaint:   Abdominal pain  HPI: Jeffrey Ware is a 61 y.o. male with medical history significant of nothing comes in with 2-3 month history of progressive worsening abdominal pain to the point he cannot handle it anymore which brought him to the ED tonight.  Pt reports loosing his wife in 2023-03-18 she died of sepsis.  Since her death, he has felt like he would not live past 6 months.  He has lost over 50 lbs since her death and thought this was related to his grief.  He does have one daughter.  The abdominal pain started almost 3 months ago, mainly in the epigastric area and back.  It is severe and he has no pain meds to take at home that are strong enough to help.  He lives alone.  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  Pt referred for admission for newly diagnosis of cancer and pain management.  He has not had any cough or fever.  Review of Systems: As per HPI otherwise 10 point review of systems negative.   History reviewed. No pertinent past medical history. none  History reviewed. No pertinent surgical history.   reports that he has been smoking Cigarettes.  He has been smoking about 0.50 packs per day. He has never used smokeless tobacco. He reports that he uses drugs, including Marijuana, about 1 time per week. He reports that he does not drink alcohol.  No Known Allergies  No family history on file. no premature CAD  Prior to Admission medications   Not on File  none  Physical Exam: Vitals:   08/13/16 1944 08/13/16 2205 08/13/16 2300  BP: 102/62 128/83 119/85  Pulse: 86 (!) 109 (!) 110  Resp: 20 (!) 26 20  Temp: 98.3 F (36.8 C)    TempSrc: Oral    SpO2: 100% 99% 99%  Weight: 52.6 kg (116 lb)    Height: 5\' 10"  (1.778 m)      Constitutional: NAD, calm, comfortable Vitals:   08/13/16 1944  08/13/16 2205 08/13/16 2300  BP: 102/62 128/83 119/85  Pulse: 86 (!) 109 (!) 110  Resp: 20 (!) 26 20  Temp: 98.3 F (36.8 C)    TempSrc: Oral    SpO2: 100% 99% 99%  Weight: 52.6 kg (116 lb)    Height: 5\' 10"  (1.778 m)     Eyes: PERRL, lids and conjunctivae normal ENMT: Mucous membranes are moist. Posterior pharynx clear of any exudate or lesions.Normal dentition.  Neck: normal, supple, no masses, no thyromegaly Respiratory: clear to auscultation bilaterally, no wheezing, no crackles. Normal respiratory effort. No accessory muscle use.  Cardiovascular: Regular rate and rhythm, no murmurs / rubs / gallops. No extremity edema. 2+ pedal pulses. No carotid bruits.  Abdomen: no tenderness, no masses palpated. No hepatosplenomegaly. Bowel sounds positive.  Musculoskeletal: no clubbing / cyanosis. No joint deformity upper and lower extremities. Good ROM, no contractures. Normal muscle tone.  Skin: no rashes, lesions, ulcers. No induration Neurologic: CN 2-12 grossly intact. Sensation intact, DTR normal. Strength 5/5 in all 4.  Psychiatric: Normal judgment and insight. Alert and oriented x 3. Normal mood.    Labs on Admission: I have personally reviewed following labs and imaging studies  CBC:  Recent Labs Lab 08/13/16 2006  WBC 9.6  NEUTROABS 7.2  HGB 13.5  HCT 39.4  MCV 91.8  PLT 397   Basic Metabolic Panel:  Recent Labs Lab 08/13/16 2006  NA 134*  K 3.8  CL 98*  CO2 25  GLUCOSE 117*  BUN 15  CREATININE 1.05  CALCIUM 9.0   GFR: Estimated Creatinine Clearance: 54.3 mL/min (by C-G formula based on SCr of 1.05 mg/dL). Liver Function Tests:  Recent Labs Lab 08/13/16 2006  AST 23  ALT 11*  ALKPHOS 158*  BILITOT 0.4  PROT 6.6  ALBUMIN 3.2*    Recent Labs Lab 08/13/16 2006  LIPASE 24   Cardiac Enzymes:  Recent Labs Lab 08/13/16 2006  TROPONINI <0.03   Thyroid Function Tests:  Recent Labs  08/13/16 2006  TSH 1.779   Urine analysis:    Component  Value Date/Time   COLORURINE YELLOW 08/13/2016 2200   APPEARANCEUR CLEAR 08/13/2016 2200   LABSPEC 1.015 08/13/2016 2200   PHURINE 6.0 08/13/2016 2200   GLUCOSEU NEGATIVE 08/13/2016 2200   HGBUR NEGATIVE 08/13/2016 2200   BILIRUBINUR NEGATIVE 08/13/2016 2200   KETONESUR TRACE (A) 08/13/2016 2200   PROTEINUR 100 (A) 08/13/2016 2200   NITRITE NEGATIVE 08/13/2016 2200   LEUKOCYTESUR NEGATIVE 08/13/2016 2200    Radiological Exams on Admission: Ct Chest W Contrast  Result Date: 08/13/2016 CLINICAL DATA:  Chronic generalized abdominal pain and shortness of breath. Loss of 50 pounds over the past 3 months. Initial encounter. EXAM: CT CHEST, ABDOMEN, AND PELVIS WITH CONTRAST TECHNIQUE: Multidetector CT imaging of the chest, abdomen and pelvis was performed following the standard protocol during bolus administration of intravenous contrast. CONTRAST:  100mL ISOVUE-300 IOPAMIDOL (ISOVUE-300) INJECTION 61% COMPARISON:  CT of the chest abdomen and pelvis performed 11/24/2008 FINDINGS: CT CHEST FINDINGS Cardiovascular: Diffuse coronary artery calcifications are seen. The heart remains normal in size. The thoracic aorta is unremarkable, aside from scattered calcification along the aortic arch and proximal great vessels. Mediastinum/Nodes: No mediastinal lymphadenopathy is seen. No pericardial effusion is identified. Tiny hypodensities within the thyroid gland are likely benign, given their size. No axillary lymphadenopathy is appreciated. Lungs/Pleura: Numerous nodules and opacities of varying size are noted throughout both lungs, along with small to moderate right and trace left pleural effusions. There is underlying interstitial prominence. This is highly suspicious for metastatic disease, with superimposed pneumonia, possibly atypical in nature. Underlying emphysema is noted at the lung apices. No pneumothorax is seen. Musculoskeletal: No acute osseous abnormalities are identified. The visualized musculature  is unremarkable in appearance. CT ABDOMEN PELVIS FINDINGS Hepatobiliary: Multiple masses of varying size are noted within the liver, with surrounding areas of contrast blush, concerning for metastatic disease given the finding in the pancreas. The gallbladder is grossly unremarkable in appearance. The common bile duct remains normal in caliber. Pancreas: There is a very poorly defined diffusely infiltrative mass at the expected location of the body of the pancreas, measuring approximately 6.2 x 5.8 x 5.3 cm, with enlarged surrounding nodes and encasement of the abdominal aorta at the level of the renal arteries. There is mild compression of the abdominal aorta at this level. The mass abuts the lesser curvature of the stomach. The remaining proximal body and head of the pancreas are grossly unremarkable. Diffuse prominence of surrounding vasculature tracking into the pancreatic body likely reflects angiogenesis. Findings are compatible with primary pancreatic malignancy. There is marked narrowing of the superior mesenteric vein and near complete effacement of the splenic vein due to the underlying mass. The mass partially encases the inferior vena cava and wraps about the anterior  aspect of vertebral body L2. Spleen: Vague fluid is seen tracking about the spleen. The spleen is otherwise unremarkable in appearance. Adrenals/Urinary Tract: There is decreased attenuation involving portions of the left adrenal gland, likely reflecting infiltration of mass, given surrounding tumor. The right adrenal gland is unremarkable in appearance. There is marked chronic right renal atrophy. Minimal left-sided hydronephrosis is of uncertain significance. The left kidney is otherwise grossly unremarkable. Stomach/Bowel: The appendix is normal in caliber, without evidence of appendicitis. The colon is grossly unremarkable in appearance. The visualized small bowel is grossly unremarkable. Vascular/Lymphatic: Scattered calcification is  seen along the abdominal aorta and its branches. As described above, there is mild compression of the abdominal aorta at the level of the mass. No definite retroperitoneal or pelvic sidewall lymphadenopathy is seen. Reproductive: The bladder is mildly distended and grossly unremarkable. The prostate remains normal in size, with minimal calcification. Other: A small amount of free fluid is seen tracking within the abdomen and pelvis. Musculoskeletal: No acute osseous abnormalities are identified. The visualized musculature is unremarkable in appearance. IMPRESSION: 1. Primary pancreatic malignancy, with a large very poorly defined diffusely infiltrative mass at the expected location of the body of the pancreas, measuring 6.2 x 5.8 x 5.3 cm. Vague mass encases the abdominal aorta at the level of the renal arteries, with mild compression of the abdominal aorta. Diffuse prominence of surrounding vasculature tracking into the pancreatic body, likely reflecting angiogenesis. 2. Marked narrowing of the superior mesenteric vein and near complete effacement of the splenic vein due to the underlying mass. The mass partially encases the inferior vena cava and wraps about the anterior aspect of vertebral body L2. 3. Small to moderate right and trace left pleural effusions, with numerous nodules and opacities of varying size throughout both lungs. Underlying interstitial prominence noted. This is highly suspicious of metastatic disease, with superimposed pneumonia, possibly atypical in nature. 4. Multiple masses of varying size within the liver, with surrounding areas of contrast blush, concerning for metastatic disease. 5. Decreased attenuation involving portions of the left adrenal gland, likely reflecting infiltration of mass. 6. Small amount of fluid noted tracking about the abdomen and pelvis. 7. Scattered aortic atherosclerosis. 8. Diffuse coronary artery calcification. 9. Marked chronic right renal atrophy. Minimal  left-sided hydronephrosis, of uncertain significance. These results were called by telephone at the time of interpretation on 08/13/2016 at 10:29 pm to Dr. Adriana Simas, who verbally acknowledged these results. Electronically Signed   By: Roanna Raider M.D.   On: 08/13/2016 22:30   Ct Abdomen Pelvis W Contrast  Result Date: 08/13/2016 CLINICAL DATA:  Chronic generalized abdominal pain and shortness of breath. Loss of 50 pounds over the past 3 months. Initial encounter. EXAM: CT CHEST, ABDOMEN, AND PELVIS WITH CONTRAST TECHNIQUE: Multidetector CT imaging of the chest, abdomen and pelvis was performed following the standard protocol during bolus administration of intravenous contrast. CONTRAST:  ISOVUE-300 IOPAMIDOL (ISOVUE-300) INJECTION 61% COMPARISON:  CT of the chest abdomen and pelvis performed 11/24/2008 FINDINGS: CT CHEST FINDINGS Cardiovascular: Diffuse coronary artery calcifications are seen. The heart remains normal in size. The thoracic aorta is unremarkable, aside from scattered calcification along the aortic arch and proximal great vessels. Mediastinum/Nodes: No mediastinal lymphadenopathy is seen. No pericardial effusion is identified. Tiny hypodensities within the thyroid gland are likely benign, given their size. No axillary lymphadenopathy is appreciated. Lungs/Pleura: Numerous nodules and opacities of varying size are noted throughout both lungs, along with small to moderate right and trace left pleural effusions. There is  underlying interstitial prominence. This is highly suspicious for metastatic disease, with superimposed pneumonia, possibly atypical in nature. Underlying emphysema is noted at the lung apices. No pneumothorax is seen. Musculoskeletal: No acute osseous abnormalities are identified. The visualized musculature is unremarkable in appearance. CT ABDOMEN PELVIS FINDINGS Hepatobiliary: Multiple masses of varying size are noted within the liver, with surrounding areas of contrast blush,  concerning for metastatic disease given the finding in the pancreas. The gallbladder is grossly unremarkable in appearance. The common bile duct remains normal in caliber. Pancreas: There is a very poorly defined diffusely infiltrative mass at the expected location of the body of the pancreas, measuring approximately 6.2 x 5.8 x 5.3 cm, with enlarged surrounding nodes and encasement of the abdominal aorta at the level of the renal arteries. There is mild compression of the abdominal aorta at this level. The mass abuts the lesser curvature of the stomach. The remaining proximal body and head of the pancreas are grossly unremarkable. Diffuse prominence of surrounding vasculature tracking into the pancreatic body likely reflects angiogenesis. Findings are compatible with primary pancreatic malignancy. There is marked narrowing of the superior mesenteric vein and near complete effacement of the splenic vein due to the underlying mass. The mass partially encases the inferior vena cava and wraps about the anterior aspect of vertebral body L2. Spleen: Vague fluid is seen tracking about the spleen. The spleen is otherwise unremarkable in appearance. Adrenals/Urinary Tract: There is decreased attenuation involving portions of the left adrenal gland, likely reflecting infiltration of mass, given surrounding tumor. The right adrenal gland is unremarkable in appearance. There is marked chronic right renal atrophy. Minimal left-sided hydronephrosis is of uncertain significance. The left kidney is otherwise grossly unremarkable. Stomach/Bowel: The appendix is normal in caliber, without evidence of appendicitis. The colon is grossly unremarkable in appearance. The visualized small bowel is grossly unremarkable. Vascular/Lymphatic: Scattered calcification is seen along the abdominal aorta and its branches. As described above, there is mild compression of the abdominal aorta at the level of the mass. No definite retroperitoneal or  pelvic sidewall lymphadenopathy is seen. Reproductive: The bladder is mildly distended and grossly unremarkable. The prostate remains normal in size, with minimal calcification. Other: A small amount of free fluid is seen tracking within the abdomen and pelvis. Musculoskeletal: No acute osseous abnormalities are identified. The visualized musculature is unremarkable in appearance. IMPRESSION: 1. Primary pancreatic malignancy, with a large very poorly defined diffusely infiltrative mass at the expected location of the body of the pancreas, measuring 6.2 x 5.8 x 5.3 cm. Vague mass encases the abdominal aorta at the level of the renal arteries, with mild compression of the abdominal aorta. Diffuse prominence of surrounding vasculature tracking into the pancreatic body, likely reflecting angiogenesis. 2. Marked narrowing of the superior mesenteric vein and near complete effacement of the splenic vein due to the underlying mass. The mass partially encases the inferior vena cava and wraps about the anterior aspect of vertebral body L2. 3. Small to moderate right and trace left pleural effusions, with numerous nodules and opacities of varying size throughout both lungs. Underlying interstitial prominence noted. This is highly suspicious of metastatic disease, with superimposed pneumonia, possibly atypical in nature. 4. Multiple masses of varying size within the liver, with surrounding areas of contrast blush, concerning for metastatic disease. 5. Decreased attenuation involving portions of the left adrenal gland, likely reflecting infiltration of mass. 6. Small amount of fluid noted tracking about the abdomen and pelvis. 7. Scattered aortic atherosclerosis. 8. Diffuse coronary artery  calcification. 9. Marked chronic right renal atrophy. Minimal left-sided hydronephrosis, of uncertain significance. These results were called by telephone at the time of interpretation on 08/13/2016 at 10:29 pm to Dr. Adriana Simas, who verbally  acknowledged these results. Electronically Signed   By: Roanna Raider M.D.   On: 08/13/2016 22:30    Assessment/Plan 61 yo male with over 50 lb wt loss and 3 months of worsening abdominal pain found to have new diffuse pancreatic cancer  Principal Problem:   Pancreatic mass/malignancy with mets - start on po oxycodone.  Obtain oncology consult in am.  Pt is aware of the severity of advancement of his likely cancer.  He is prepared to hear that there may be nothing to offer in the form of treatment, he would like to speak to an oncologist first before making any decisions.  He is aware the prognosis is likely grim.  Active Problems:   FTT (failure to thrive) in adult- noted   Abdominal pain- noted    DVT prophylaxis:  scds Code Status:  Presumptive full code, did not bring this up to him tonight due to allowing him to absorb the new cancer findings first Family Communication:  none Disposition Plan:  Per day team Consults called:  Oncology requested in EPIC Admission status:  observation   Antonela Freiman A MD [facility]  If 7PM-7AM, please contact night-coverage [email or web address] Password [email or web address]  08/14/2016, 12:01 AM

## 2016-08-15 LAB — CANCER ANTIGEN 19-9: CA 19-9: 16770 U/mL — ABNORMAL HIGH (ref 0–35)

## 2016-09-24 DEATH — deceased

## 2018-07-18 IMAGING — CT CT ABD-PELV W/ CM
2 of 5 series · 11 of 36 positions shown, 13 images · IV contrast (Isovue)
Comparison: CT of the chest abdomen and pelvis performed 11/24/2008

CLINICAL DATA: Chronic generalized abdominal pain and shortness of
breath. Loss of 50 pounds over the past 3 months. Initial encounter.

EXAM:
CT CHEST, ABDOMEN, AND PELVIS WITH CONTRAST
TECHNIQUE: Multidetector CT imaging of the chest, abdomen and pelvis was
performed following the standard protocol during bolus
administration of intravenous contrast.
CONTRAST:  100mL EYGJZH-RLL IOPAMIDOL (EYGJZH-RLL) INJECTION 61%

[Series 2: cap with · axial · 0.70mm/px · z∈[-623,-118]mm · 8 of 131 slices shown, 10 images]
[im 15/131  mediastinal]
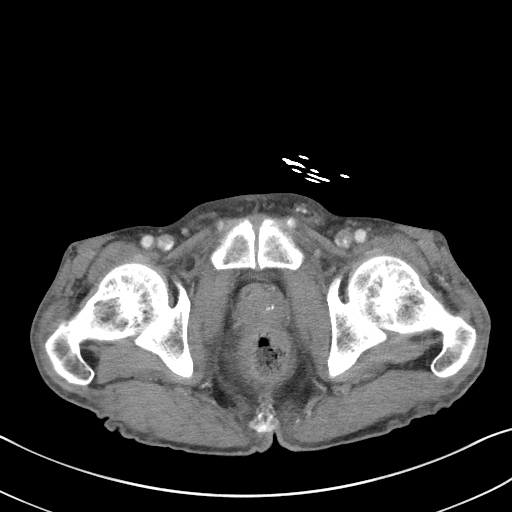
[im 15/131  lung]
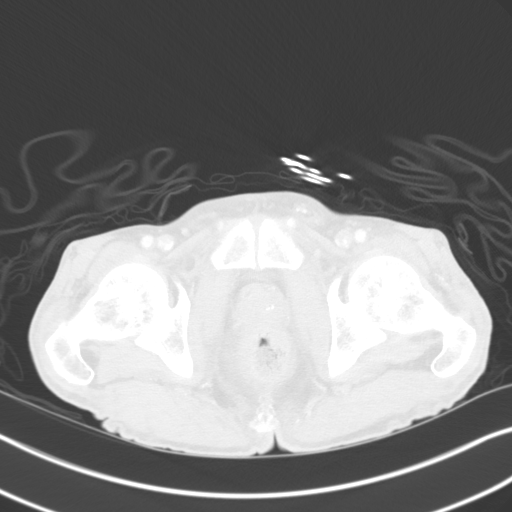
[im 29/131  lung]
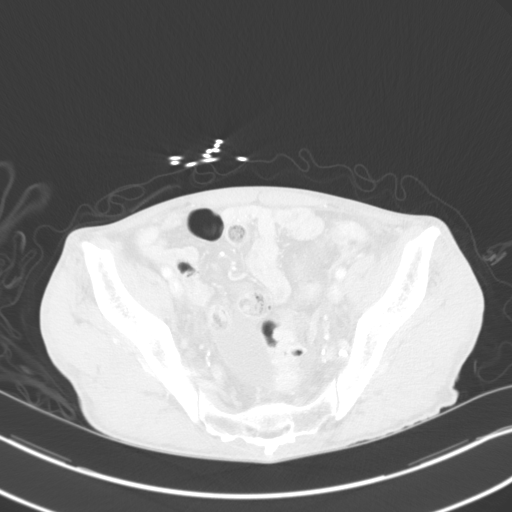
[im 44/131  lung]
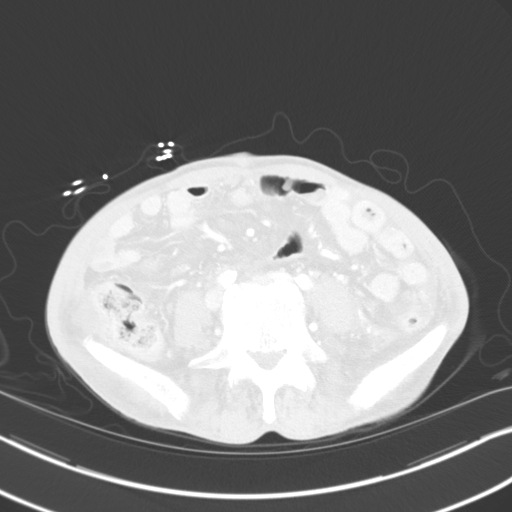
[im 58/131  lung]
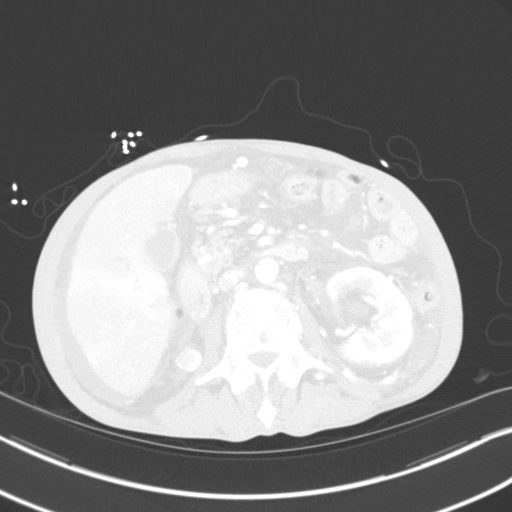
[im 73/131  mediastinal]
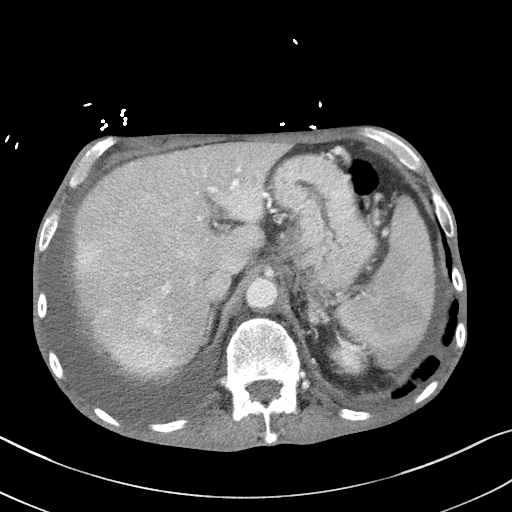
[im 73/131  lung]
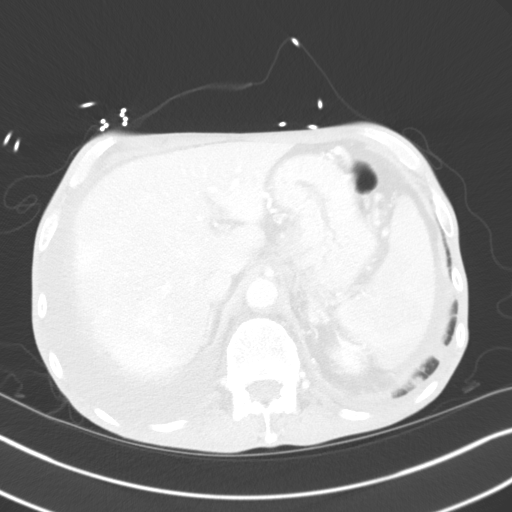
[im 87/131  lung]
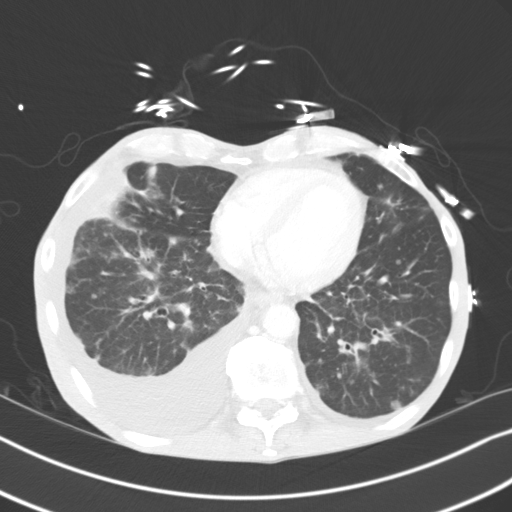
[im 102/131  lung]
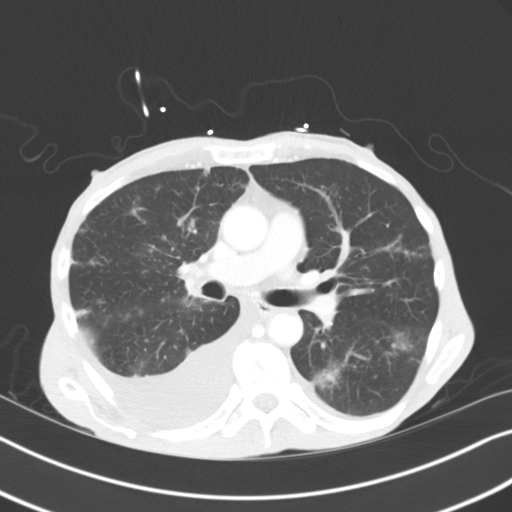
[im 116/131  lung]
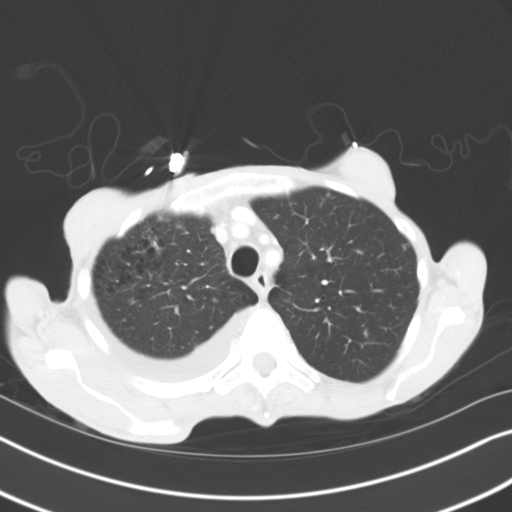

[Series 5: coronals · coronal · 0.82mm/px · 3 of 130 slices shown]
[im 26/130  lung]
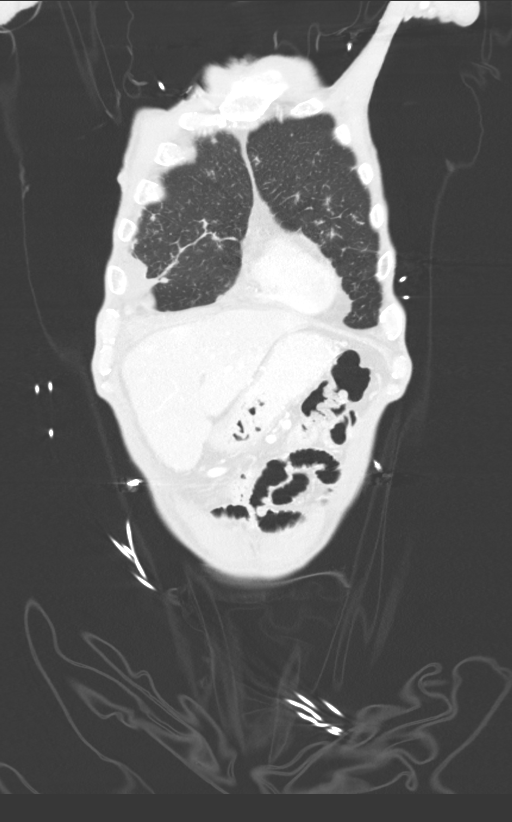
[im 52/130  lung]
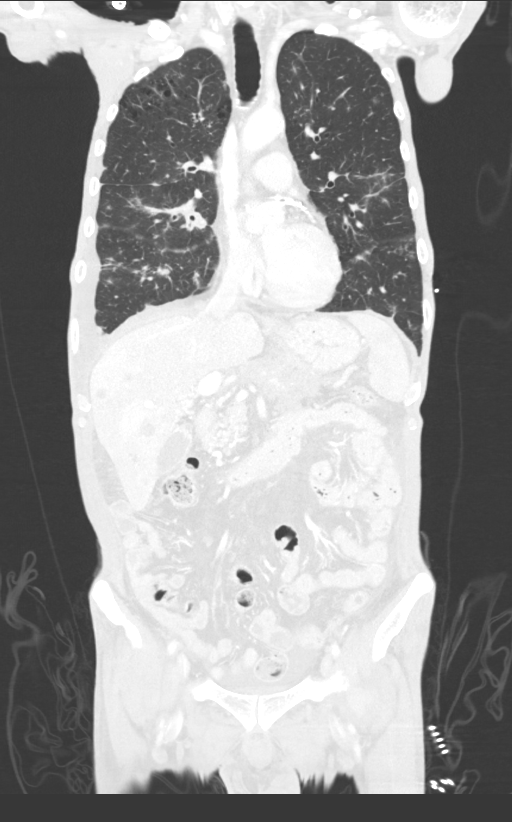
[im 78/130  lung]
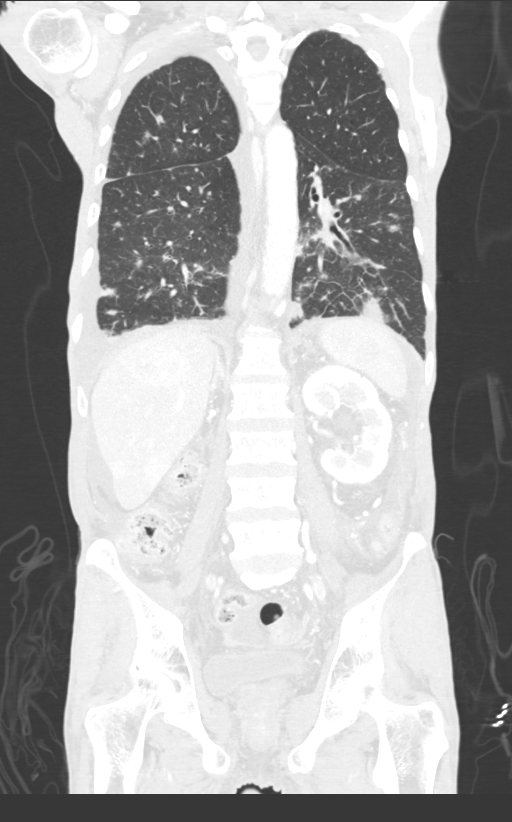

[11 of 36 positions shown; findings below may reference images not displayed]

FINDINGS: CT CHEST FINDINGS

Cardiovascular: Diffuse coronary artery calcifications are seen. The
heart remains normal in size. The thoracic aorta is unremarkable,
aside from scattered calcification along the aortic arch and
proximal great vessels.

Mediastinum/Nodes: No mediastinal lymphadenopathy is seen. No
pericardial effusion is identified. Tiny hypodensities within the
thyroid gland are likely benign, given their size. No axillary
lymphadenopathy is appreciated.

Lungs/Pleura: Numerous nodules and opacities of varying size are
noted throughout both lungs, along with small to moderate right and
trace left pleural effusions. There is underlying interstitial
prominence. This is highly suspicious for metastatic disease, with
superimposed pneumonia, possibly atypical in nature.

Underlying emphysema is noted at the lung apices. No pneumothorax is
seen.

Musculoskeletal: No acute osseous abnormalities are identified. The
visualized musculature is unremarkable in appearance.

CT ABDOMEN PELVIS FINDINGS

Hepatobiliary: Multiple masses of varying size are noted within the
liver, with surrounding areas of contrast blush, concerning for
metastatic disease given the finding in the pancreas. The
gallbladder is grossly unremarkable in appearance. The common bile
duct remains normal in caliber.

Pancreas: There is a very poorly defined diffusely infiltrative mass
at the expected location of the body of the pancreas, measuring
approximately 6.2 x 5.8 x 5.3 cm, with enlarged surrounding nodes
and encasement of the abdominal aorta at the level of the renal
arteries. There is mild compression of the abdominal aorta at this
level.

The mass abuts the lesser curvature of the stomach. The remaining
proximal body and head of the pancreas are grossly unremarkable.
Diffuse prominence of surrounding vasculature tracking into the
pancreatic body likely reflects angiogenesis. Findings are
compatible with primary pancreatic malignancy.

There is marked narrowing of the superior mesenteric vein and near
complete effacement of the splenic vein due to the underlying mass.
The mass partially encases the inferior vena cava and wraps about
the anterior aspect of vertebral body L2.

Spleen: Vague fluid is seen tracking about the spleen. The spleen is
otherwise unremarkable in appearance.

Adrenals/Urinary Tract: There is decreased attenuation involving
portions of the left adrenal gland, likely reflecting infiltration
of mass, given surrounding tumor. The right adrenal gland is
unremarkable in appearance.

There is marked chronic right renal atrophy. Minimal left-sided
hydronephrosis is of uncertain significance. The left kidney is
otherwise grossly unremarkable.

Stomach/Bowel: The appendix is normal in caliber, without evidence
of appendicitis. The colon is grossly unremarkable in appearance.
The visualized small bowel is grossly unremarkable.

Vascular/Lymphatic: Scattered calcification is seen along the
abdominal aorta and its branches. As described above, there is mild
compression of the abdominal aorta at the level of the mass. No
definite retroperitoneal or pelvic sidewall lymphadenopathy is seen.

Reproductive: The bladder is mildly distended and grossly
unremarkable. The prostate remains normal in size, with minimal
calcification.

Other: A small amount of free fluid is seen tracking within the
abdomen and pelvis.

Musculoskeletal: No acute osseous abnormalities are identified. The
visualized musculature is unremarkable in appearance.
IMPRESSION: 1. Primary pancreatic malignancy, with a large very poorly defined
diffusely infiltrative mass at the expected location of the body of
the pancreas, measuring 6.2 x 5.8 x 5.3 cm. Vague mass encases the
abdominal aorta at the level of the renal arteries, with mild
compression of the abdominal aorta. Diffuse prominence of
surrounding vasculature tracking into the pancreatic body, likely
reflecting angiogenesis.
2. Marked narrowing of the superior mesenteric vein and near
complete effacement of the splenic vein due to the underlying mass.
The mass partially encases the inferior vena cava and wraps about
the anterior aspect of vertebral body L2.
3. Small to moderate right and trace left pleural effusions, with
numerous nodules and opacities of varying size throughout both
lungs. Underlying interstitial prominence noted. This is highly
suspicious of metastatic disease, with superimposed pneumonia,
possibly atypical in nature.
4. Multiple masses of varying size within the liver, with
surrounding areas of contrast blush, concerning for metastatic
disease.
5. Decreased attenuation involving portions of the left adrenal
gland, likely reflecting infiltration of mass.
6. Small amount of fluid noted tracking about the abdomen and
pelvis.
7. Scattered aortic atherosclerosis.
8. Diffuse coronary artery calcification.
9. Marked chronic right renal atrophy. Minimal left-sided
hydronephrosis, of uncertain significance.
These results were called by telephone at the time of interpretation
on 08/13/2016 at [DATE] to Dr. Bepul, who verbally acknowledged
these results.
